# Patient Record
Sex: Female | Born: 1975 | Race: Black or African American | Hispanic: No | Marital: Married
Health system: Southern US, Community
[De-identification: ages and names within clinical notes are randomized; demographics above are authoritative.]

---

## 2020-02-15 ENCOUNTER — Emergency Department (HOSPITAL_COMMUNITY): Payer: Medicaid Other

## 2020-02-15 ENCOUNTER — Other Ambulatory Visit: Payer: Self-pay

## 2020-02-15 ENCOUNTER — Emergency Department (HOSPITAL_COMMUNITY)
Admission: EM | Admit: 2020-02-15 | Discharge: 2020-02-15 | Disposition: A | Discharge status: Home / Self Care | Payer: Medicaid Other | Attending: Emergency Medicine | Admitting: Emergency Medicine

## 2020-02-15 ENCOUNTER — Encounter (HOSPITAL_COMMUNITY): Payer: Self-pay | Admitting: Emergency Medicine

## 2020-02-15 DIAGNOSIS — Y9301 Activity, walking, marching and hiking: Secondary | ICD-10-CM | POA: Insufficient documentation

## 2020-02-15 DIAGNOSIS — Y999 Unspecified external cause status: Secondary | ICD-10-CM | POA: Diagnosis not present

## 2020-02-15 DIAGNOSIS — Y92410 Unspecified street and highway as the place of occurrence of the external cause: Secondary | ICD-10-CM | POA: Diagnosis not present

## 2020-02-15 DIAGNOSIS — W1842XA Slipping, tripping and stumbling without falling due to stepping into hole or opening, initial encounter: Secondary | ICD-10-CM | POA: Diagnosis not present

## 2020-02-15 DIAGNOSIS — E119 Type 2 diabetes mellitus without complications: Secondary | ICD-10-CM | POA: Insufficient documentation

## 2020-02-15 DIAGNOSIS — M25562 Pain in left knee: Secondary | ICD-10-CM

## 2020-02-15 MED ORDER — OXYCODONE-ACETAMINOPHEN 5-325 MG PO TABS
1.0000 | ORAL_TABLET | Freq: Once | ORAL | Status: AC
  Administered 2020-02-15: 1 via ORAL
  Filled 2020-02-15: qty 1

## 2020-02-15 NOTE — ED Notes (Signed)
No answer from pt in waiting room 

## 2020-02-15 NOTE — ED Triage Notes (Signed)
Pt reports she stepped into a "dip" and her left knee "buckled and popped."  It is swollen and she is unable to put any weight on it.

## 2020-02-15 NOTE — ED Notes (Signed)
Ortho tech called to recheck knee device

## 2020-02-15 NOTE — Progress Notes (Signed)
Orthopedic Tech Progress Note Patient Details:  Madison Hawkins Dec 14, 1975 [id number]  Ortho Devices Type of Ortho Device: Crutches, Knee Immobilizer Ortho Device/Splint Location: LUE Ortho Device/Splint Interventions: Ordered, Application, Adjustment   Post Interventions Patient Tolerated: Ambulated well, Well Instructions Provided: Poper ambulation with device, Care of device   Donald Pore 02/15/2020, 10:14 AM

## 2020-02-15 NOTE — ED Provider Notes (Signed)
[facility] EMERGENCY DEPARTMENT Provider Note   CSN: [id number] Arrival date & time: 02/15/20  0154     History Chief Complaint  Patient presents with  . Knee Pain    CHRYSTIAN RESSLER is a 44 y.o. female history of obesity and diabetes.  Patient presents today for left knee pain onset around midnight, 10 hours ago.  She was walking when she stepped into a small dip in the road, this caused her left knee to buckle and pop underneath her, she describes immediate onset pain of the left knee, constant, severe, nonradiating worsened with ambulation and movement no alleviating factors, no medications prior to arrival for symptoms.  Associated with swelling of the left knee.  Denies head injury, loss consciousness, blood thinner use, neck pain, back pain, chest pain, abdominal pain, pain to the other extremities, numbness/weakness, tingling, hip/ankle pain or any additional concerns.  HPI     History reviewed. No pertinent past medical history.  There are no problems to display for this patient.   History reviewed. No pertinent surgical history.   OB History   No obstetric history on file.     No family history on file.  Social History   Tobacco Use  . Smoking status: Not on file  Substance Use Topics  . Alcohol use: Not on file  . Drug use: Not on file    Home Medications Prior to Admission medications   Not on File    Allergies    Patient has no known allergies.  Review of Systems   Review of Systems  Constitutional: Negative.  Negative for chills and fever.  Cardiovascular: Negative.  Negative for chest pain.  Gastrointestinal: Negative.  Negative for abdominal pain.  Musculoskeletal: Positive for arthralgias (Left Knee). Negative for back pain and neck pain.  Skin: Negative.  Negative for color change and wound.  Neurological: Negative.  Negative for syncope, weakness, numbness and headaches.    Physical Exam Updated Vital Signs BP 135/75  (BP Location: Right Wrist)   Pulse 65   Temp 98.1 F (36.7 C) (Oral)   Resp 18   SpO2 98%   Physical Exam Constitutional:      General: She is not in acute distress.    Appearance: Normal appearance. She is well-developed. She is obese. She is not ill-appearing or diaphoretic.  HENT:     Head: Normocephalic and atraumatic.  Eyes:     General: Vision grossly intact. Gaze aligned appropriately.     Pupils: Pupils are equal, round, and reactive to light.  Neck:     Trachea: Trachea and phonation normal.  Pulmonary:     Effort: Pulmonary effort is normal. No respiratory distress.  Abdominal:     General: There is no distension.     Palpations: Abdomen is soft.     Tenderness: There is no abdominal tenderness. There is no guarding or rebound.  Musculoskeletal:        General: Normal range of motion.     Cervical back: Normal range of motion.     Comments: No midline C/T/L spinal tenderness to palpation, no paraspinal muscle tenderness, no deformity, crepitus, or step-off noted. No sign of injury to the neck or back. - Appropriate range of motion and strength of all major joints of the bilateral upper extremities without pain. - Left knee: Diffuse swelling and tenderness.  No skin breaks or changes.  Increased pain with movement.  No step-off or crepitus.  Neurovascular intact distally strong equal  pedal pulses good capillary refill and sensation.  Good range of motion at the left hip and left ankle without pain.  No tenderness to palpation of the thigh or lower leg.  Skin:    General: Skin is warm and dry.  Neurological:     Mental Status: She is alert.     GCS: GCS eye subscore is 4. GCS verbal subscore is 5. GCS motor subscore is 6.     Comments: Speech is clear and goal oriented, follows commands Major Cranial nerves without deficit, no facial droop Moves extremities without ataxia, coordination intact  Psychiatric:        Behavior: Behavior normal.    ED Results /  Procedures / Treatments   Labs (all labs ordered are listed, but only abnormal results are displayed) Labs Reviewed - No data to display  EKG None  Radiology DG Knee Complete 4 Views Left  Result Date: 02/15/2020 CLINICAL DATA:  Fall with knee pain EXAM: LEFT KNEE - COMPLETE 4+ VIEW COMPARISON:  None. FINDINGS: No fracture or malalignment. No significant knee effusion. There are mild degenerative changes. There is infrapatellar soft tissue swelling. IMPRESSION: No acute osseous abnormality. Electronically Signed   By: Jasmine Pang M.D.   On: 02/15/2020 03:54    Procedures Procedures (including critical care time)  Medications Ordered in ED Medications  oxyCODONE-acetaminophen (PERCOCET/ROXICET) 5-325 MG per tablet 1 tablet (1 tablet Oral Given 02/15/20 0932)    ED Course  I have reviewed the triage vital signs and the nursing notes.  Pertinent labs & imaging results that were available during my care of the patient were reviewed by me and considered in my medical decision making (of chart for details).    MDM Rules/Calculators/A&P                          Additional history obtained from: 1. Nursing notes from this visit. ----------------------------------------- DG Left Knee:  IMPRESSION:  No acute osseous abnormality.   Patient with swelling tenderness of the left knee diffusely.  No skin break.  No evidence of cellulitis, septic arthritis, DVT, compartment syndrome or other life-threatening pathologies.  There was initial concern based on patient body habitus for a tibial plateau fracture.  She did not have a direct impact to the tibia.  Most likely ligamentous injury of the left knee versus meniscal injury.  CT left knee ordered for further evaluation of left knee pain and rule out of plateau fracture however patient refused and requested discharge.  She was placed in a knee immobilizer given crutches and referred to orthopedist.  On reevaluation there is no point tenderness  to the tibial plateau. Patient was made aware of the risks of leaving without rule out of tibial plateau fracture, I discussed with patient that if she had any worsening of swelling, pain or develop any numbness that she should return immediately to the nearest ER.  There is no evidence of compartment syndrome at this time she is neurovascularly intact distally.  Patient has a mental capacity to make her own medical decisions and chooses to forego CT of the left knee at this time.  Patient encouraged to continue using knee immobilizer until seen and evaluated by orthopedist.  Discussed plan with Dr. Freida Busman who agrees with above.  Patient has been discharged, family member driving home.  At this time there does not appear to be any evidence of an acute emergency medical condition and the patient appears stable for  discharge with appropriate outpatient follow up. Diagnosis was discussed with patient who verbalizes understanding of care plan and is agreeable to discharge. I have discussed return precautions with patient who verbalizes understanding. Patient encouraged to follow-up with their PCP and ortho. All questions answered.  Note: Portions of this report may have been transcribed using voice recognition software. Every effort was made to ensure accuracy; however, inadvertent computerized transcription errors may still be present. Final Clinical Impression(s) / ED Diagnoses Final diagnoses:  Acute pain of left knee    Rx / DC Orders ED Discharge Orders    None       Elizabeth Palau 02/15/20 1022    Lorre Nick, MD 02/15/20 1134

## 2020-02-15 NOTE — Discharge Instructions (Addendum)
At this time there does not appear to be the presence of an emergent medical condition, however there is always the potential for conditions to change. Please read and follow the below instructions.  Please return to the Emergency Department immediately for any new or worsening symptoms. Please be sure to follow up with your Primary Care Provider within one week regarding your visit today; please call their office to schedule an appointment even if you are feeling better for a follow-up visit. You refused CT scan of your left knee to look for a tibial plateau fracture today.  I strongly recommend that you stay in the ED to have a full evaluation of your left knee injury.  There is potential for unseen injury/fractures today and I strongly recommend that you follow-up with an orthopedic specialist for reevaluation.  Please use the knee immobilizer to avoid further injury and use the crutches to avoid placing weight on your left leg.  Use rest ice and elevation to help with pain and swelling.  Go Immediately to the [facility] Emergency Department if:  Your knee swells, and the swelling gets worse. You cannot move your knee. You have very bad knee pain. You develop numbness or weakness You have any new/concerning or worsening of symptoms  Please read the additional information packets attached to your discharge summary.  Do not take your medicine if  develop an itchy rash, swelling in your mouth or lips, or difficulty breathing; call 911 and seek immediate emergency medical attention if this occurs.  You may review your lab tests and imaging results in their entirety on your MyChart account.  Please discuss all results of fully with your primary care provider and other specialist at your follow-up visit.  Note: Portions of this text may have been transcribed using voice recognition software. Every effort was made to ensure accuracy; however, inadvertent computerized transcription errors may still be  present.

## 2020-02-15 NOTE — ED Notes (Signed)
Patient verbalizes understanding of discharge instructions . Opportunity for questions and answers were provided . Armband removed by staff ,Pt discharged from ED. W/C  offered at D/C  and Declined W/C at D/C and was escorted to lobby by RN.  

## 2021-01-13 IMAGING — CR DG KNEE COMPLETE 4+V*L*
4 series · 4 of 4 positions shown · non-contrast
Comparison: None.

CLINICAL DATA: Fall with knee pain

EXAM:
LEFT KNEE - COMPLETE 4+ VIEW

[knee ap]
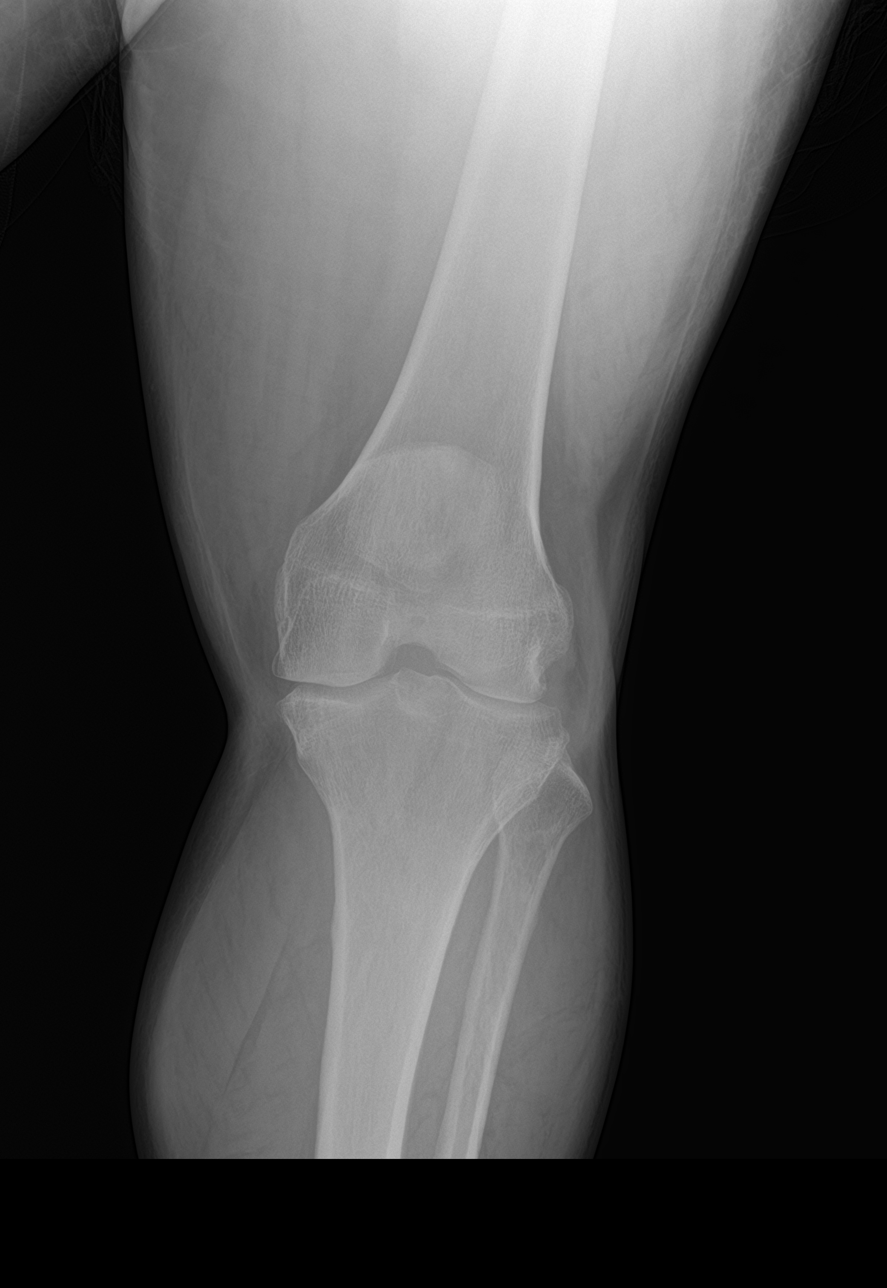

[knee obl (1 of 2)]
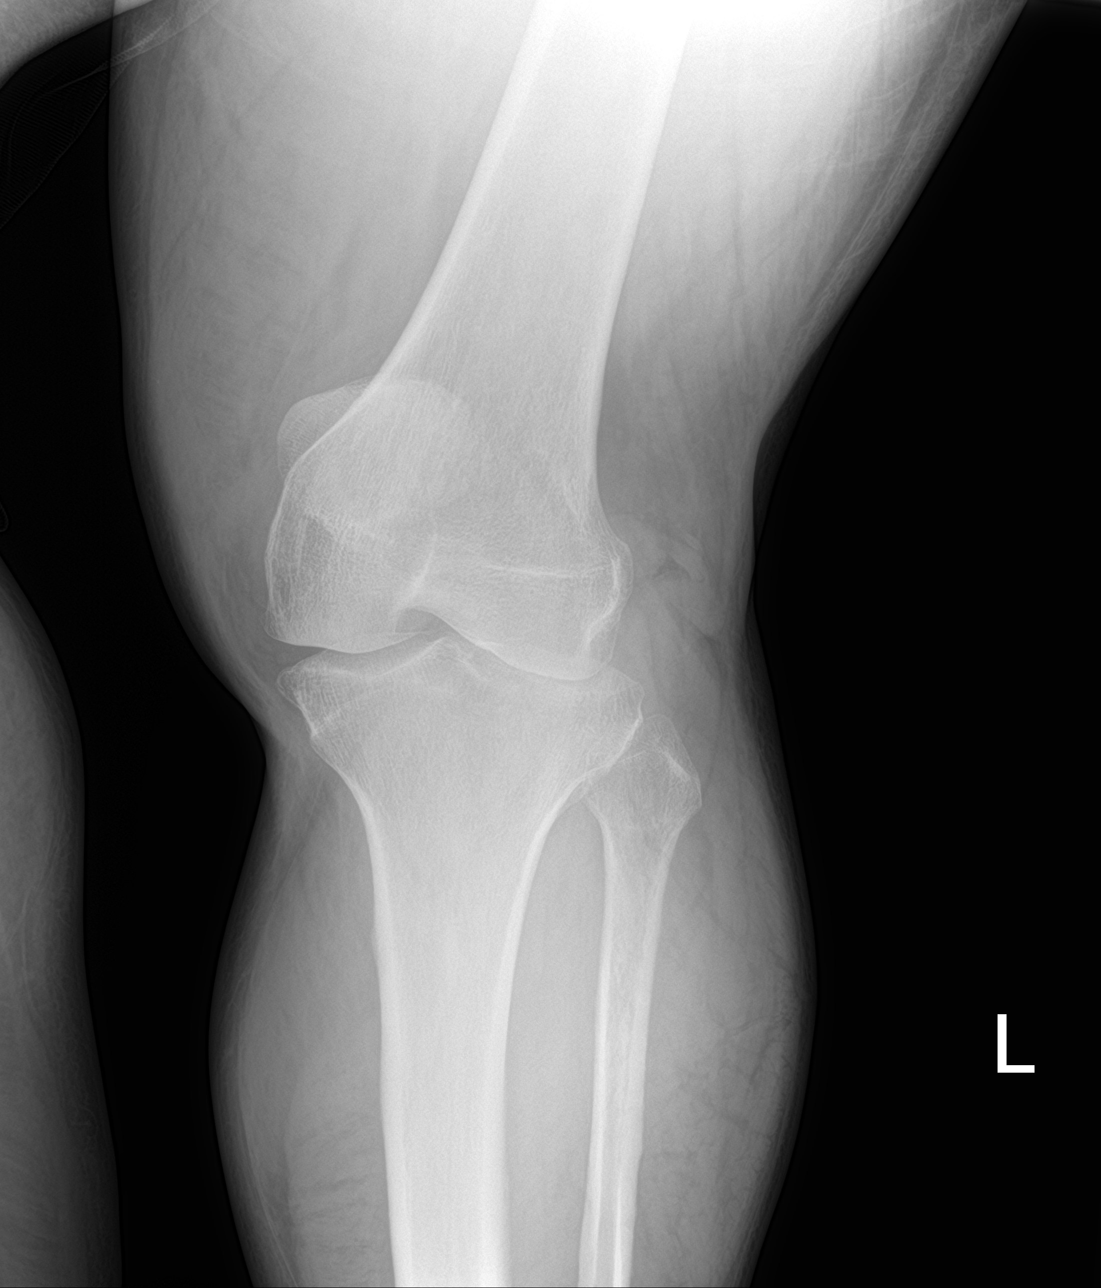

[knee obl (2 of 2)]
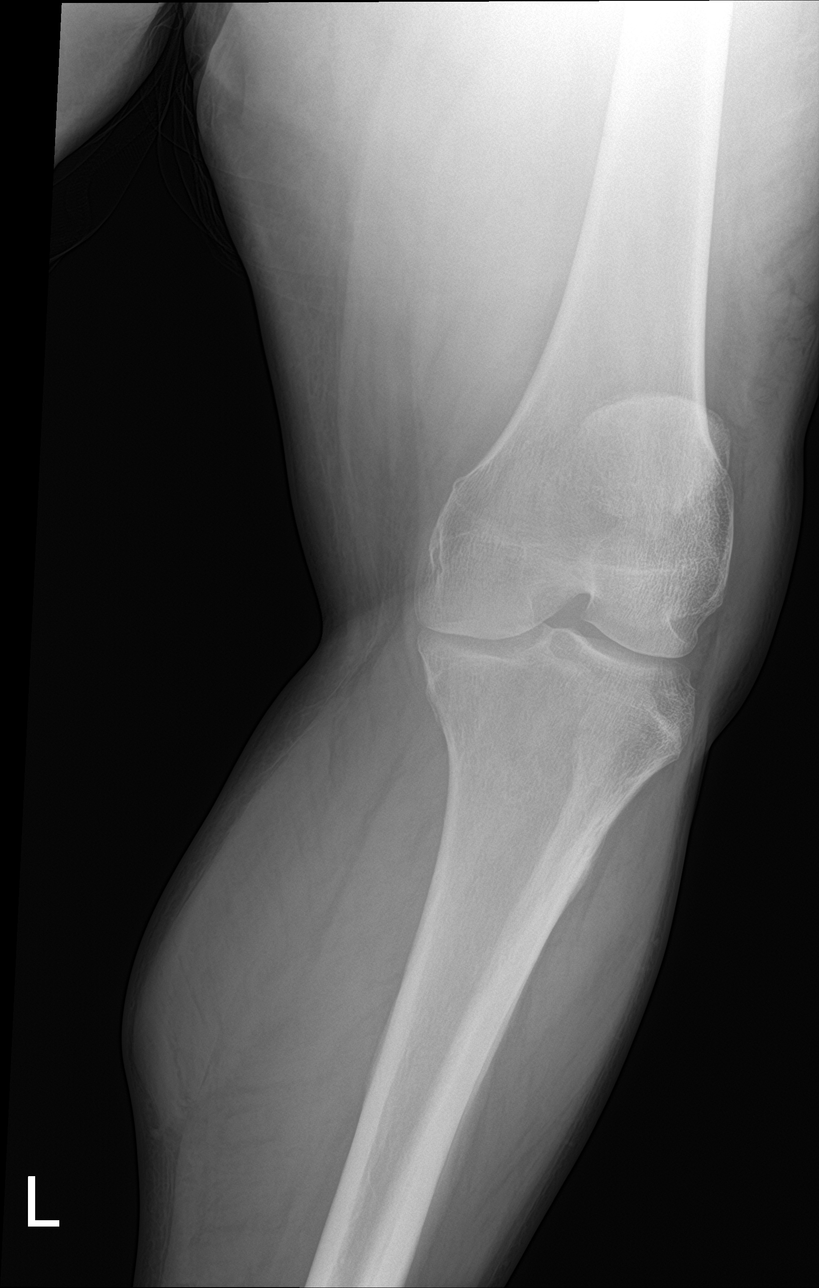

[knee lat]
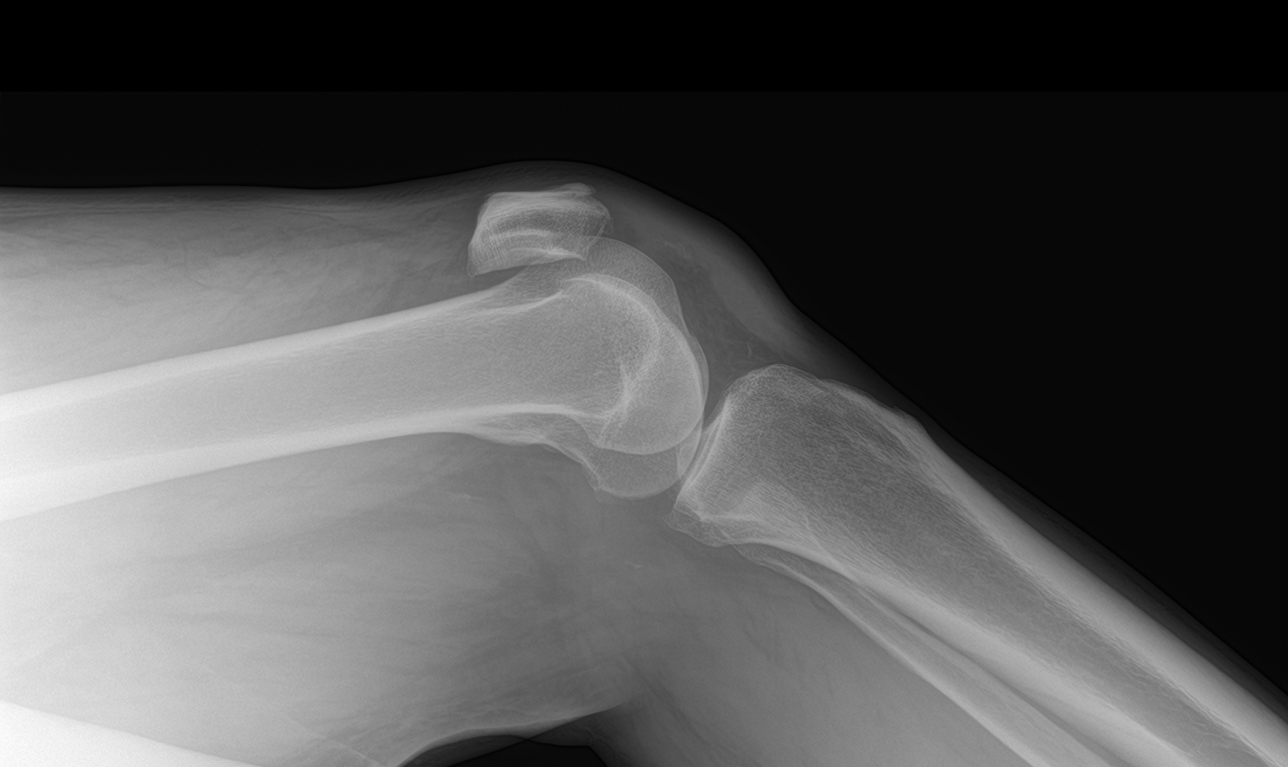

[4 of 4 positions shown; findings below may reference images not displayed]

FINDINGS: No fracture or malalignment. No significant knee effusion. There are
mild degenerative changes. There is infrapatellar soft tissue
swelling.
IMPRESSION: No acute osseous abnormality.
